# Patient Record
Sex: Female | Born: 1970
Health system: Southern US, Community
[De-identification: ages and names within clinical notes are randomized; demographics above are authoritative.]

## PROBLEM LIST (undated history)

## (undated) DIAGNOSIS — I10 Essential (primary) hypertension: Secondary | ICD-10-CM

## (undated) DIAGNOSIS — F329 Major depressive disorder, single episode, unspecified: Secondary | ICD-10-CM

## (undated) DIAGNOSIS — F32A Depression, unspecified: Secondary | ICD-10-CM

## (undated) HISTORY — PX: KNEE SURGERY: SHX244

---

## 2002-01-11 ENCOUNTER — Encounter: Admission: RE | Admit: 2002-01-11 | Discharge: 2002-04-11 | Payer: Self-pay | Admitting: Internal Medicine

## 2003-08-10 ENCOUNTER — Other Ambulatory Visit: Admission: RE | Admit: 2003-08-10 | Discharge: 2003-08-10 | Payer: Self-pay | Admitting: Obstetrics and Gynecology

## 2005-07-21 ENCOUNTER — Other Ambulatory Visit: Admission: RE | Admit: 2005-07-21 | Discharge: 2005-07-21 | Payer: Self-pay | Admitting: Obstetrics and Gynecology

## 2007-06-30 ENCOUNTER — Emergency Department (HOSPITAL_COMMUNITY): Admission: EM | Admit: 2007-06-30 | Discharge: 2007-06-30 | Payer: Self-pay | Admitting: Family Medicine

## 2007-10-29 ENCOUNTER — Emergency Department (HOSPITAL_COMMUNITY): Admission: EM | Admit: 2007-10-29 | Discharge: 2007-10-29 | Payer: Self-pay | Admitting: Emergency Medicine

## 2008-06-26 ENCOUNTER — Emergency Department (HOSPITAL_COMMUNITY): Admission: EM | Admit: 2008-06-26 | Discharge: 2008-06-26 | Payer: Self-pay | Admitting: Emergency Medicine

## 2008-06-28 ENCOUNTER — Emergency Department (HOSPITAL_COMMUNITY): Admission: EM | Admit: 2008-06-28 | Discharge: 2008-06-28 | Payer: Self-pay | Admitting: Family Medicine

## 2008-11-08 ENCOUNTER — Emergency Department: Payer: Self-pay | Admitting: Emergency Medicine

## 2009-03-06 ENCOUNTER — Emergency Department (HOSPITAL_COMMUNITY): Admission: EM | Admit: 2009-03-06 | Discharge: 2009-03-06 | Payer: Self-pay | Admitting: Family Medicine

## 2009-06-20 ENCOUNTER — Encounter: Admission: RE | Admit: 2009-06-20 | Discharge: 2009-09-18 | Payer: Self-pay | Admitting: Internal Medicine

## 2009-06-24 ENCOUNTER — Emergency Department (HOSPITAL_COMMUNITY): Admission: EM | Admit: 2009-06-24 | Discharge: 2009-06-24 | Payer: Self-pay | Admitting: Family Medicine

## 2009-10-01 IMAGING — CR DG SHOULDER 2+V*L*
3 series · 3 of 3 positions shown · non-contrast
Comparison: None

CLINICAL DATA: *PAIN;

LEFT SHOULDER - 2+ VIEW

[view not recorded (1 of 3)]
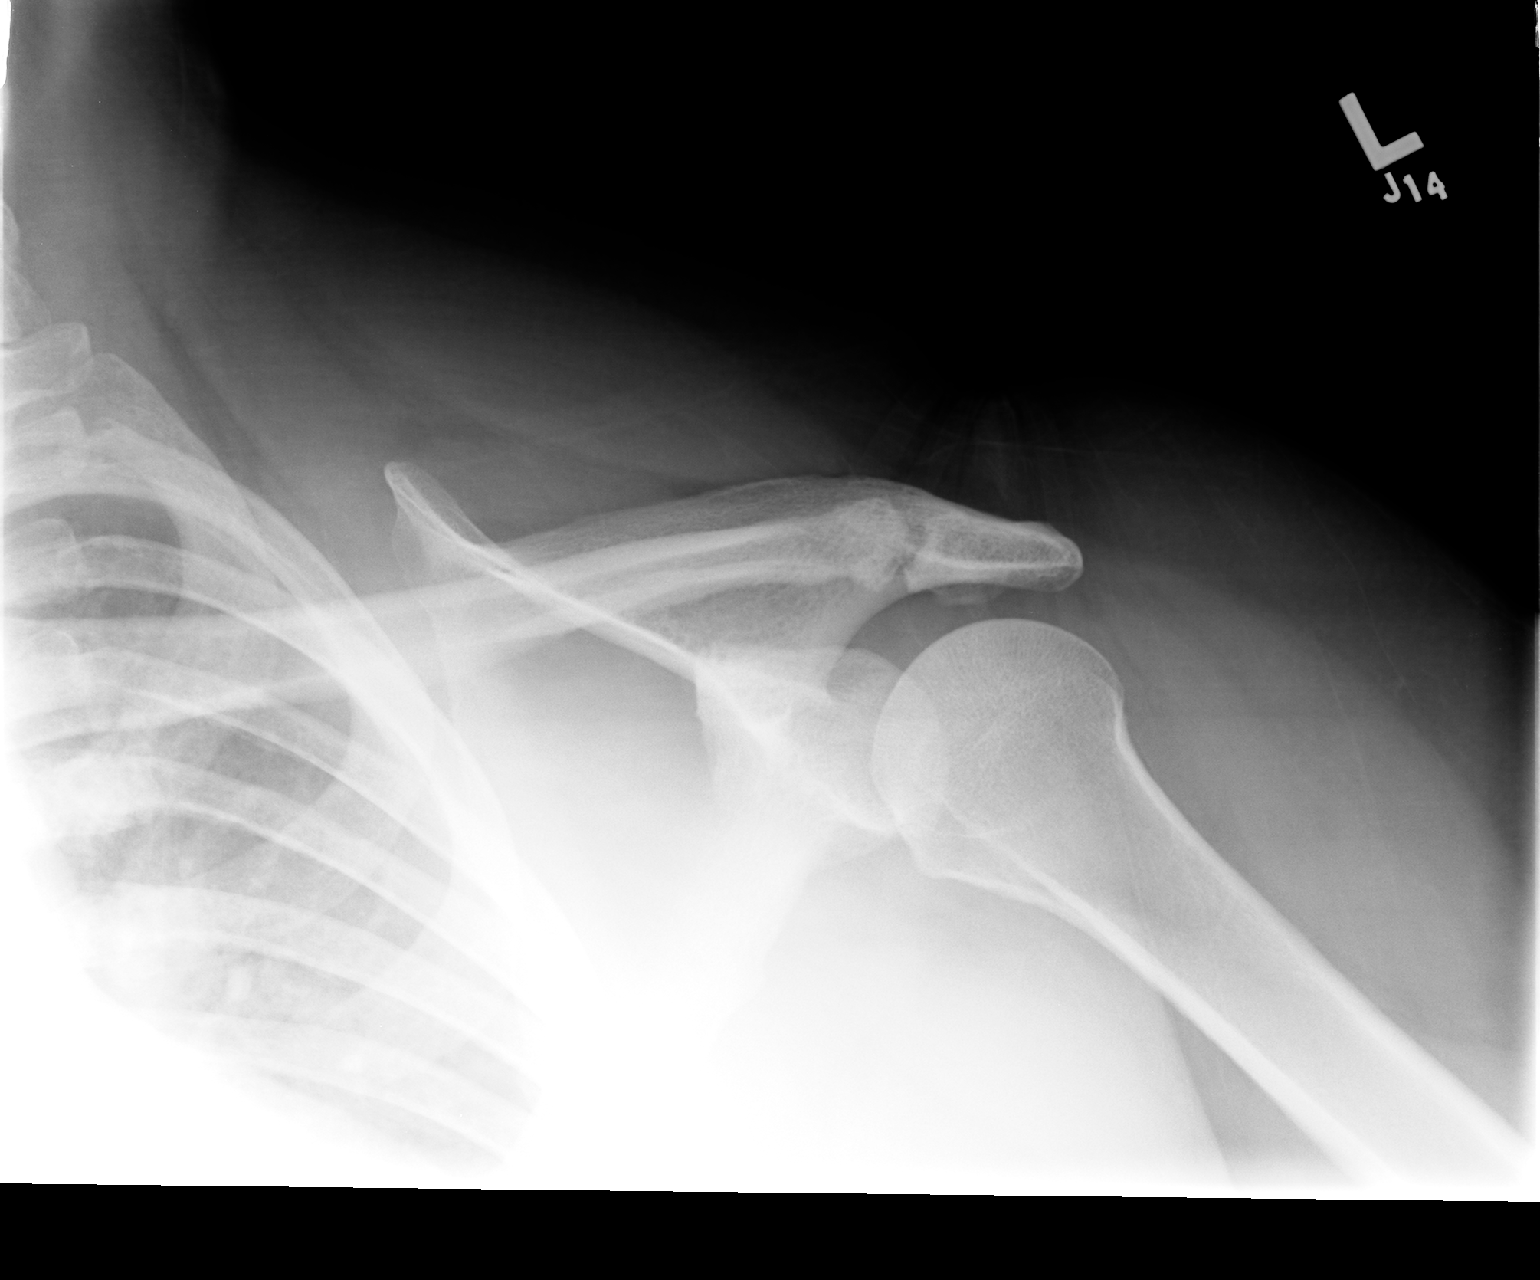

[view not recorded (2 of 3)]
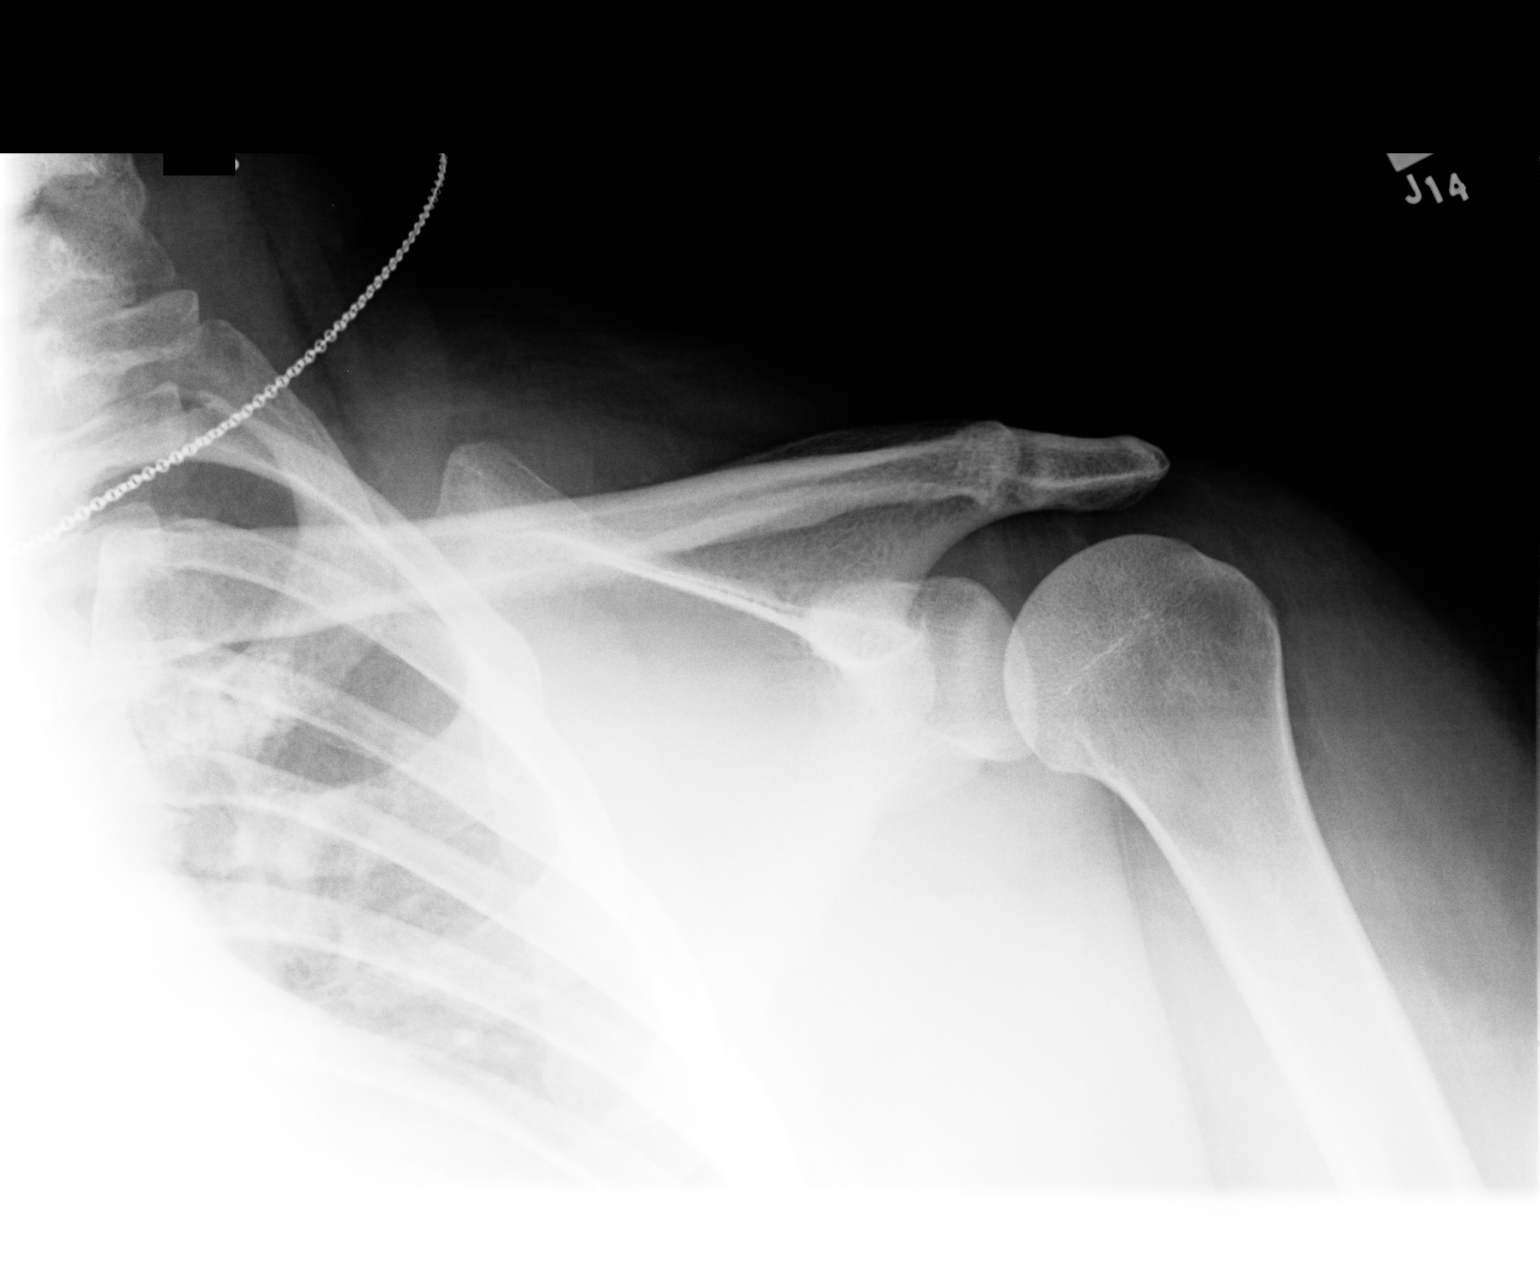

[view not recorded (3 of 3)]
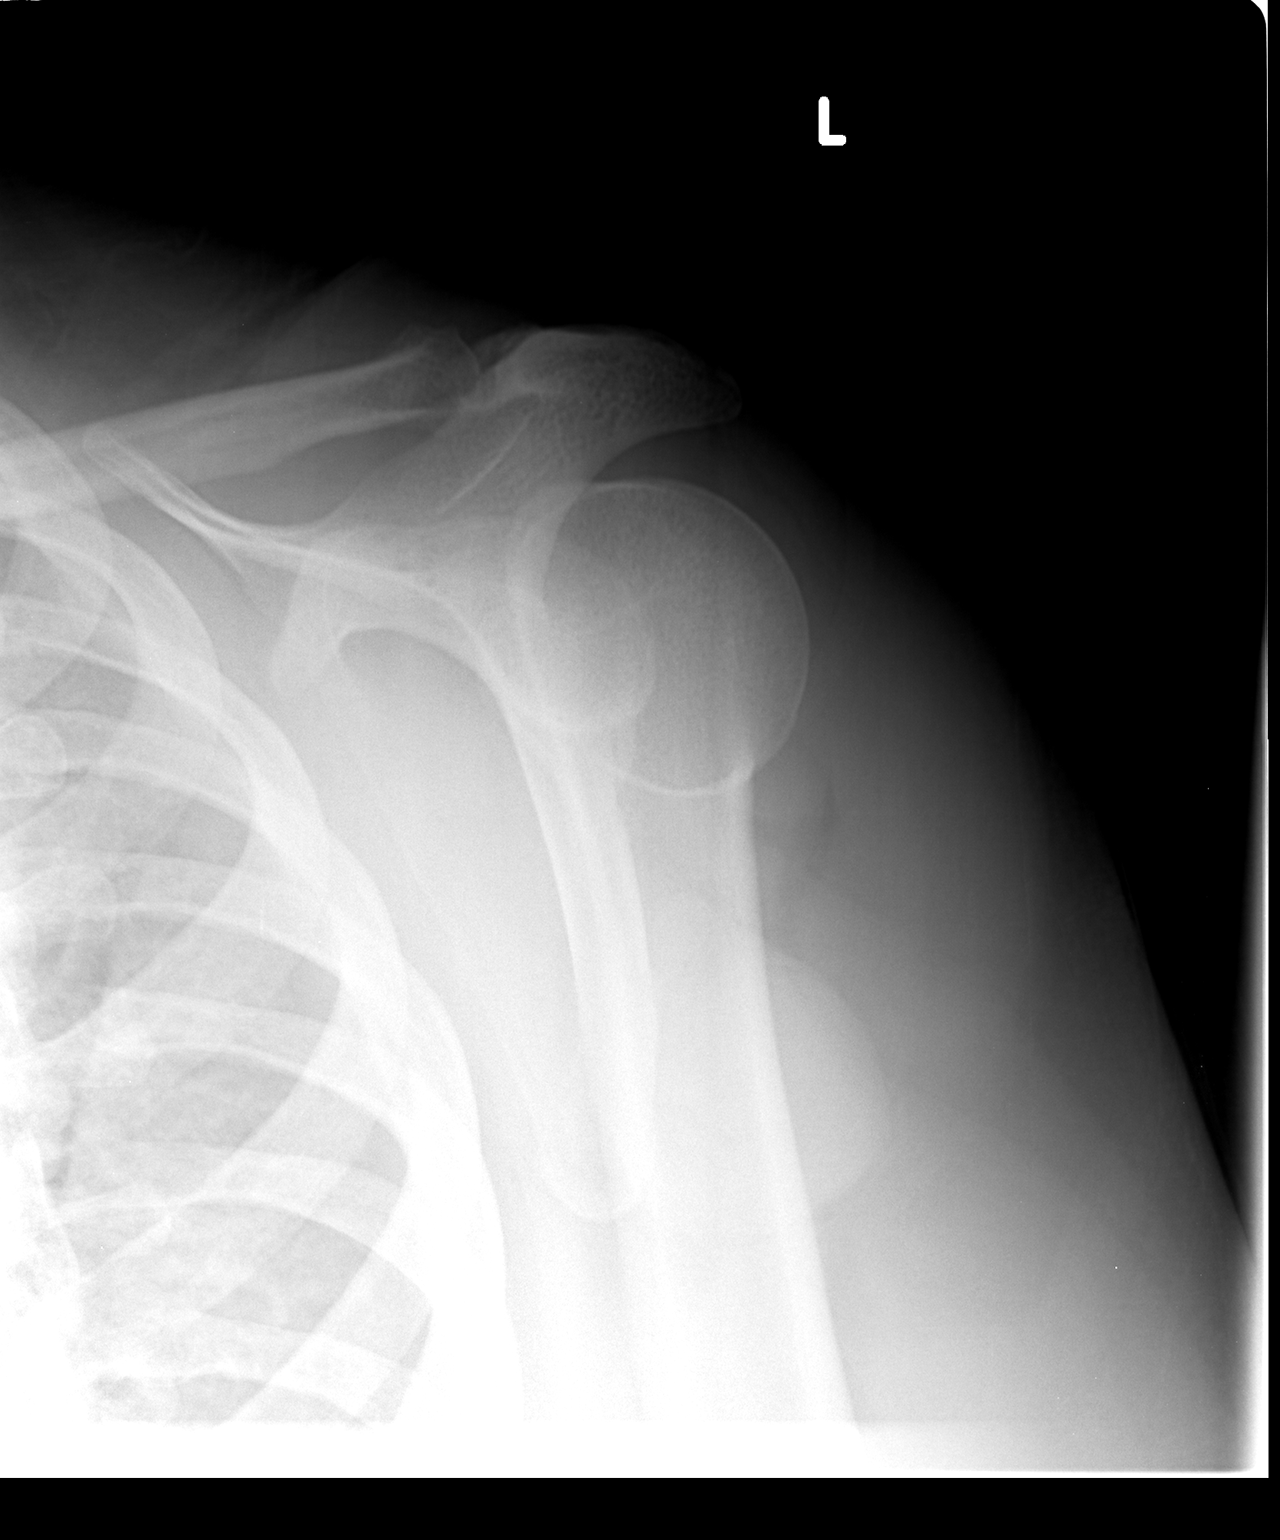

[3 of 3 positions shown; findings below may reference images not displayed]

FINDINGS: No fracture or dislocation.  There are degenerative
changes in the acromioclavicular joint.  Visualized left lung is
clear.
IMPRESSION: Acromioclavicular joint osteoarthritis.

## 2010-01-05 ENCOUNTER — Emergency Department (HOSPITAL_COMMUNITY): Admission: EM | Admit: 2010-01-05 | Discharge: 2010-01-05 | Payer: Self-pay | Admitting: Family Medicine

## 2010-03-04 ENCOUNTER — Emergency Department (HOSPITAL_COMMUNITY): Admission: EM | Admit: 2010-03-04 | Discharge: 2010-03-04 | Payer: Self-pay | Admitting: Family Medicine

## 2010-06-20 LAB — CULTURE, ROUTINE-ABSCESS

## 2010-07-01 LAB — CULTURE, ROUTINE-ABSCESS

## 2010-07-18 LAB — CULTURE, ROUTINE-ABSCESS

## 2010-08-06 ENCOUNTER — Inpatient Hospital Stay (INDEPENDENT_AMBULATORY_CARE_PROVIDER_SITE_OTHER)
Admission: RE | Admit: 2010-08-06 | Discharge: 2010-08-06 | Disposition: A | Discharge status: Home / Self Care | Payer: 59 | Source: Ambulatory Visit | Attending: Emergency Medicine | Admitting: Emergency Medicine

## 2010-08-06 DIAGNOSIS — J029 Acute pharyngitis, unspecified: Secondary | ICD-10-CM

## 2010-12-01 ENCOUNTER — Inpatient Hospital Stay (INDEPENDENT_AMBULATORY_CARE_PROVIDER_SITE_OTHER)
Admission: RE | Admit: 2010-12-01 | Discharge: 2010-12-01 | Disposition: A | Discharge status: Home / Self Care | Payer: 59 | Source: Ambulatory Visit | Attending: Family Medicine | Admitting: Family Medicine

## 2010-12-01 DIAGNOSIS — J029 Acute pharyngitis, unspecified: Secondary | ICD-10-CM

## 2011-11-29 ENCOUNTER — Encounter (HOSPITAL_COMMUNITY): Payer: Self-pay | Admitting: Emergency Medicine

## 2011-11-29 ENCOUNTER — Emergency Department (HOSPITAL_COMMUNITY)
Admission: EM | Admit: 2011-11-29 | Discharge: 2011-11-29 | Disposition: A | Discharge status: Home / Self Care | Payer: 59 | Source: Home / Self Care | Attending: Emergency Medicine | Admitting: Emergency Medicine

## 2011-11-29 DIAGNOSIS — J069 Acute upper respiratory infection, unspecified: Secondary | ICD-10-CM

## 2011-11-29 DIAGNOSIS — J209 Acute bronchitis, unspecified: Secondary | ICD-10-CM

## 2011-11-29 DIAGNOSIS — J019 Acute sinusitis, unspecified: Secondary | ICD-10-CM

## 2011-11-29 DIAGNOSIS — H6693 Otitis media, unspecified, bilateral: Secondary | ICD-10-CM

## 2011-11-29 HISTORY — DX: Major depressive disorder, single episode, unspecified: F32.9

## 2011-11-29 HISTORY — DX: Essential (primary) hypertension: I10

## 2011-11-29 HISTORY — DX: Depression, unspecified: F32.A

## 2011-11-29 MED ORDER — AMOXICILLIN-POT CLAVULANATE 875-125 MG PO TABS: 1.0000 | ORAL_TABLET | Freq: Two times a day (BID) | ORAL | Status: AC

## 2011-11-29 MED ORDER — ALBUTEROL SULFATE HFA 108 (90 BASE) MCG/ACT IN AERS: 1.0000 | INHALATION_SPRAY | Freq: Four times a day (QID) | RESPIRATORY_TRACT | Status: AC | PRN

## 2011-11-29 MED ORDER — HYDROCOD POLST-CHLORPHEN POLST 10-8 MG/5ML PO LQCR: 5.0000 mL | Freq: Two times a day (BID) | ORAL | Status: AC | PRN

## 2011-11-29 MED ORDER — FLUTICASONE PROPIONATE 50 MCG/ACT NA SUSP: 2.0000 | Freq: Every day | NASAL | Status: AC

## 2011-11-29 NOTE — ED Notes (Signed)
Dr Lorenz Coaster aware of blood pressure readings obtained today by tim forsyth, emt

## 2011-11-29 NOTE — ED Provider Notes (Signed)
Chief Complaint  Patient presents with  . URI    History of Present Illness:   Brittany Peterson is a 41 year old female who has had a one-week history of nasal congestion with yellow-green drainage, headache, cough productive of tan sputum, right ear pain, fever, and sore throat. She has diabetes and hypertension and is on birth control pills. She denies any chest pain, wheezing, shortness of breath, or GI complaints.  Review of Systems:  Other than noted above, the patient denies any of the following symptoms. Systemic:  No fever, chills, sweats, fatigue, myalgias, headache, or anorexia. Eye:  No redness, pain or drainage. ENT:  No earache, ear congestion, nasal congestion, sneezing, rhinorrhea, sinus pressure, sinus pain, post nasal drip, or sore throat. Lungs:  No cough, sputum production, wheezing, shortness of breath, or chest pain. GI:  No abdominal pain, nausea, vomiting, or diarrhea. Skin:  No rash or itching.  PMFSH:  Past medical history, family history, social history, meds, and allergies were reviewed.  Physical Exam:   Vital signs:  BP 165/85  Pulse 103  Temp 98.9 F (37.2 C) (Oral)  Resp 20  SpO2 97%  LMP 10/24/2011 General:  Alert, in no distress. Eye:  No conjunctival injection or drainage. Lids were normal. ENT:  Her right TM was pink in her left TM was red. The canals were clear.  Nasal mucosa was clear and uncongested, without drainage.  Mucous membranes were moist.  Pharynx was clear, without exudate or drainage.  There were no oral ulcerations or lesions. Neck:  Supple, no adenopathy, tenderness or mass. Lungs:  No respiratory distress.  Lungs were clear to auscultation, rales or rhonchi.  She did have a single expiratory wheeze in the left upper lobe, but this cleared after deep inspiration. Breath sounds were clear and equal bilaterally. Lungs were resonant to percussion.  No egophony. Heart:  Regular rhythm, without gallops, murmers or rubs. Skin:  Clear, warm, and dry,  without rash or lesions.  Assessment:  The primary encounter diagnosis was Viral upper respiratory infection. Diagnoses of Acute bronchitis, Acute sinus infection, and Bilateral otitis media were also pertinent to this visit.  Plan:   1.  The following meds were prescribed:   New Prescriptions   ALBUTEROL (PROVENTIL HFA;VENTOLIN HFA) 108 (90 BASE) MCG/ACT INHALER    Inhale 1-2 puffs into the lungs every 6 (six) hours as needed for wheezing.   AMOXICILLIN-CLAVULANATE (AUGMENTIN) 875-125 MG PER TABLET    Take 1 tablet by mouth 2 (two) times daily.   CHLORPHENIRAMINE-HYDROCODONE (TUSSIONEX) 10-8 MG/5ML LQCR    Take 5 mLs by mouth every 12 (twelve) hours as needed.   FLUTICASONE (FLONASE) 50 MCG/ACT NASAL SPRAY    Place 2 sprays into the nose daily.   2.  The patient was instructed in symptomatic care and handouts were given. 3.  The patient was told to return if becoming worse in any way, if no better in 3 or 4 days, and given some red flag symptoms that would indicate earlier return.   Reuben Likes, MD 11/29/11 2139

## 2011-11-29 NOTE — ED Notes (Signed)
Started with nasal drainage, then cough.  Productive- cough at times, yellow-greenish color.  Headache, feverish initially, but not now.

## 2011-11-29 NOTE — ED Notes (Signed)
Dr Lorenz Coaster came in room during triage process.

## 2012-03-08 ENCOUNTER — Emergency Department (HOSPITAL_COMMUNITY)
Admit: 2012-03-08 | Discharge: 2012-03-08 | Disposition: A | Discharge status: Home / Self Care | Payer: 59 | Attending: Emergency Medicine | Admitting: Emergency Medicine

## 2012-03-08 ENCOUNTER — Encounter (HOSPITAL_COMMUNITY): Payer: Self-pay | Admitting: Emergency Medicine

## 2012-03-08 ENCOUNTER — Emergency Department (HOSPITAL_COMMUNITY)
Admission: EM | Admit: 2012-03-08 | Discharge: 2012-03-08 | Disposition: A | Discharge status: Home / Self Care | Payer: 59 | Source: Home / Self Care | Attending: Emergency Medicine | Admitting: Emergency Medicine

## 2012-03-08 DIAGNOSIS — M25569 Pain in unspecified knee: Secondary | ICD-10-CM | POA: Insufficient documentation

## 2012-03-08 MED ORDER — MELOXICAM 7.5 MG PO TABS: 7.5000 mg | ORAL_TABLET | Freq: Every day | ORAL | Status: AC

## 2012-03-08 MED ORDER — KETOROLAC TROMETHAMINE 60 MG/2ML IM SOLN
60.0000 mg | Freq: Once | INTRAMUSCULAR | Status: AC
  Administered 2012-03-08: 60 mg via INTRAMUSCULAR

## 2012-03-08 MED ORDER — KETOROLAC TROMETHAMINE 60 MG/2ML IM SOLN
INTRAMUSCULAR | Status: AC
  Filled 2012-03-08: qty 2

## 2012-03-08 MED ORDER — TRAMADOL HCL 50 MG PO TABS: 50.0000 mg | ORAL_TABLET | Freq: Four times a day (QID) | ORAL | Status: AC | PRN

## 2012-03-08 NOTE — ED Notes (Signed)
Ryan in Radiology made aware patient was on the way

## 2012-03-08 NOTE — ED Provider Notes (Signed)
History     CSN: [id number]  Arrival date & time 03/08/12  1411   None     Chief Complaint  Patient presents with  . Knee Pain    (Consider location/radiation/quality/duration/timing/severity/associated sxs/prior treatment) Patient is a 41 y.o. female presenting with knee pain. The history is provided by the patient.  Knee Pain This is a new problem. The current episode started 2 days ago. The problem has not changed since onset.The symptoms are aggravated by walking. Nothing relieves the symptoms. She has tried nothing for the symptoms.  complains of left knee pain.  Mechaniism of injury: none known.  Symptoms have been intermittent, noticed to be worse after sitting for awhile and then attempting to ambulate.  No prior history of related problems.  Has taken aspirin and ibuprofen for pain with no relief.  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   Is currently on prednisone and naprosyn for known SI joint pain.   Past Medical History  Diagnosis Date  . Hypertension   . Diabetes mellitus   . Depression     Past Surgical History  Procedure Date  . Knee surgery     No family history on file.  History  Substance Use Topics  . Smoking status: Never Smoker   . Smokeless tobacco: Not on file  . Alcohol Use: No    OB History    Grav Para Term Preterm Abortions TAB SAB Ect Mult Living                  Review of Systems  Musculoskeletal: Positive for arthralgias and gait problem.  All other systems reviewed and are negative.    Allergies  Keflex  Home Medications   Current Outpatient Rx  Name  Route  Sig  Dispense  Refill  . CYMBALTA PO   Oral   Take by mouth.         . METFORMIN HCL PO   Oral   Take by mouth.         . ALBUTEROL SULFATE HFA 108 (90 BASE) MCG/ACT IN AERS   Inhalation   Inhale 1-2 puffs into the lungs every 6 (six) hours as needed for wheezing.   1 Inhaler   0   . HYDROCOD POLST-CPM POLST ER 10-8 MG/5ML PO LQCR   Oral   Take 5 mLs by mouth  every 12 (twelve) hours as needed.   140 mL   0   . FLUTICASONE PROPIONATE 50 MCG/ACT NA SUSP   Nasal   Place 2 sprays into the nose daily.   16 g   0   . VICTOZA [place]   Subcutaneous   Inject into the skin.         Marland Kitchen LOSARTAN POTASSIUM PO   Oral   Take by mouth.         . MELOXICAM 7.5 MG PO TABS   Oral   Take 1 tablet (7.5 mg total) by mouth daily.   30 tablet   1   . OVER THE COUNTER MEDICATION      Birth counter pill. Patient reports taking multiple cold medicines         . TRAMADOL HCL 50 MG PO TABS   Oral   Take 1 tablet (50 mg total) by mouth every 6 (six) hours as needed for pain.   15 tablet   0     BP 149/80  Pulse 102  Temp 98.5 F (36.9 C) (Oral)  Resp 16  SpO2  99%  LMP 02/26/2012  Physical Exam  Nursing note and vitals reviewed. Constitutional: She is oriented to person, place, and time. Vital signs are normal. She appears well-developed and well-nourished. She is active and cooperative.  HENT:  Head: Normocephalic.  Eyes: Conjunctivae normal are normal. Pupils are equal, round, and reactive to light. No scleral icterus.  Neck: Trachea normal. Neck supple.  Cardiovascular: Normal rate, regular rhythm and intact distal pulses.   Pulmonary/Chest: Effort normal and breath sounds normal.  Musculoskeletal: Normal range of motion.       Right knee: Normal.       Left knee: Normal.       Decreased extension noted secondary to pain  Neurological: She is alert and oriented to person, place, and time. No cranial nerve deficit or sensory deficit.  Skin: Skin is warm and dry.  Psychiatric: She has a normal mood and affect. Her speech is normal and behavior is normal. Judgment and thought content normal. Cognition and memory are normal.    ED Course  Procedures (including critical care time)  Labs Reviewed - No data to display Dg Knee Complete 4 Views Left  03/08/2012  *RADIOLOGY REPORT*  Clinical Data: Pain, no known injury  LEFT KNEE - COMPLETE  4+ VIEW  Comparison: None.  Findings: Four views of the left knee submitted.  No acute fracture or subluxation.  There is narrowing of medial joint compartment. Spurring of medial and lateral femoral condyle.  Spurring of medial tibial plateau.  Narrowing of patellofemoral joint space.  No joint effusion.  IMPRESSION: No acute fracture or subluxation.  Degenerative changes as described above.   Original Report Authenticated By: Natasha Mead, M.D.      1. Knee pain       MDM  Continue medication as previously prescribed.  Add mobic and ultram as needed.  Heat therapy.  Follow up with orthopedist in one week as needed.          Johnsie Kindred, NP 03/08/12 1726

## 2012-03-08 NOTE — ED Notes (Signed)
Pt c/o left knee pain since Friday... Painful to extend and flex knee... Denies: inj/trauma to site, fevers, vomiting, nauseas, diarrhea... Pain will increase w/activity... Has taken NSAID'S w/little relief.

## 2012-03-09 NOTE — ED Provider Notes (Signed)
Medical screening examination/treatment/procedure(s) were performed by non-physician practitioner and as supervising physician I was immediately available for consultation/collaboration.  Leslee Home, M.D.   Reuben Likes, MD 03/09/12 [phone]

## 2013-10-18 ENCOUNTER — Emergency Department (HOSPITAL_COMMUNITY)
Admission: EM | Admit: 2013-10-18 | Discharge: 2013-10-18 | Disposition: A | Discharge status: Home / Self Care | Payer: 59 | Source: Home / Self Care | Attending: Family Medicine | Admitting: Family Medicine

## 2013-10-18 ENCOUNTER — Encounter (HOSPITAL_COMMUNITY): Payer: Self-pay | Admitting: Emergency Medicine

## 2013-10-18 DIAGNOSIS — J302 Other seasonal allergic rhinitis: Secondary | ICD-10-CM

## 2013-10-18 DIAGNOSIS — J309 Allergic rhinitis, unspecified: Secondary | ICD-10-CM

## 2013-10-18 MED ORDER — IPRATROPIUM BROMIDE 0.06 % NA SOLN: 2.0000 | Freq: Four times a day (QID) | NASAL | Status: AC

## 2013-10-18 MED ORDER — MINOCYCLINE HCL 100 MG PO CAPS: 100.0000 mg | ORAL_CAPSULE | Freq: Two times a day (BID) | ORAL | Status: AC

## 2013-10-18 NOTE — ED Provider Notes (Signed)
CSN: [id number]     Arrival date & time 10/18/13  1409 History   First MD Initiated Contact with Patient 10/18/13 1448     Chief Complaint  Patient presents with  . URI   (Consider location/radiation/quality/duration/timing/severity/associated sxs/prior Treatment) Patient is a 43 y.o. female presenting with URI. The history is provided by the patient.  URI Presenting symptoms: congestion, cough and rhinorrhea   Presenting symptoms: no fever   Severity:  Moderate Onset quality:  Gradual Duration:  4 days Progression:  Unchanged Chronicity:  New Relieved by:  None tried Worsened by:  Nothing tried Ineffective treatments:  None tried Associated symptoms: sneezing   Associated symptoms: no wheezing     Past Medical History  Diagnosis Date  . Hypertension   . Diabetes mellitus   . Depression    Past Surgical History  Procedure Laterality Date  . Knee surgery     No family history on file. History  Substance Use Topics  . Smoking status: Never Smoker   . Smokeless tobacco: Not on file  . Alcohol Use: No   OB History   Grav Para Term Preterm Abortions TAB SAB Ect Mult Living                 Review of Systems  Constitutional: Negative.  Negative for fever.  HENT: Positive for congestion, postnasal drip, rhinorrhea and sneezing.   Respiratory: Positive for cough. Negative for shortness of breath and wheezing.   Cardiovascular: Negative.   Hematological: Negative for adenopathy.    Allergies  Keflex  Home Medications   Prior to Admission medications   Medication Sig Start Date End Date Taking? Authorizing Provider  DULoxetine HCl (CYMBALTA PO) Take by mouth.   Yes Historical Provider, MD  LOSARTAN POTASSIUM PO Take by mouth.   Yes Historical Provider, MD  METFORMIN HCL PO Take by mouth.   Yes Historical Provider, MD  albuterol (PROVENTIL HFA;VENTOLIN HFA) 108 (90 BASE) MCG/ACT inhaler Inhale 1-2 puffs into the lungs every 6 (six) hours as needed for wheezing.  11/29/11 11/28/12  Reuben Likes, MD  chlorpheniramine-HYDROcodone (TUSSIONEX) 10-8 MG/5ML [facility] Take 5 mLs by mouth every 12 (twelve) hours as needed. 11/29/11   Reuben Likes, MD  fluticasone (FLONASE) 50 MCG/ACT nasal spray Place 2 sprays into the nose daily. 11/29/11 11/28/12  Reuben Likes, MD  ipratropium (ATROVENT) 0.06 % nasal spray Place 2 sprays into both nostrils 4 (four) times daily. 10/18/13   Linna Hoff, MD  Liraglutide (VICTOZA [place]) Inject into the skin.    Historical Provider, MD  meloxicam (MOBIC) 7.5 MG tablet Take 1 tablet (7.5 mg total) by mouth daily. 03/08/12   Johnsie Kindred, NP  minocycline (MINOCIN,DYNACIN) 100 MG capsule Take 1 capsule (100 mg total) by mouth 2 (two) times daily. 10/18/13   Linna Hoff, MD  OVER THE COUNTER MEDICATION Birth counter pill. Patient reports taking multiple cold medicines    Historical Provider, MD  traMADol (ULTRAM) 50 MG tablet Take 1 tablet (50 mg total) by mouth every 6 (six) hours as needed for pain. 03/08/12   Johnsie Kindred, NP   BP 139/80  Pulse 92  Temp(Src) 98.6 F (37 C) (Oral)  Resp 20  SpO2 95%  LMP 10/04/2013 Physical Exam  Nursing note and vitals reviewed. Constitutional: She is oriented to person, place, and time. She appears well-developed and well-nourished. No distress.  HENT:  Right Ear: External ear normal.  Left Ear: External ear normal.  Nose:  Mucosal edema and rhinorrhea present.  Mouth/Throat: Oropharynx is clear and moist.  Neck: Normal range of motion. Neck supple.  Cardiovascular: Regular rhythm and normal heart sounds.   Pulmonary/Chest: Effort normal and breath sounds normal.  Neurological: She is alert and oriented to person, place, and time.  Skin: Skin is warm and dry.    ED Course  Procedures (including critical care time) Labs Review Labs Reviewed - No data to display  Imaging Review No results found.   MDM   1. Seasonal allergic reaction        Linna HoffJames D Kindl, MD 10/18/13  [phone]

## 2013-10-18 NOTE — ED Notes (Signed)
Pt c/o cold/sinus sx x 4 days Sx include: productive cough, HA, runny nose, congestion Denies f/v/n/d, SOB, wheezing Taking OTC cold meds w/no relief Alert w/no signs of acute distress.

## 2015-05-03 DIAGNOSIS — I10 Essential (primary) hypertension: Secondary | ICD-10-CM | POA: Diagnosis not present

## 2015-05-03 DIAGNOSIS — F509 Eating disorder, unspecified: Secondary | ICD-10-CM | POA: Diagnosis not present

## 2015-05-03 DIAGNOSIS — S90811A Abrasion, right foot, initial encounter: Secondary | ICD-10-CM | POA: Diagnosis not present

## 2015-05-03 DIAGNOSIS — Z3009 Encounter for other general counseling and advice on contraception: Secondary | ICD-10-CM | POA: Diagnosis not present

## 2015-05-03 DIAGNOSIS — F321 Major depressive disorder, single episode, moderate: Secondary | ICD-10-CM | POA: Diagnosis not present

## 2015-05-03 DIAGNOSIS — R635 Abnormal weight gain: Secondary | ICD-10-CM | POA: Diagnosis not present

## 2015-05-03 DIAGNOSIS — E11621 Type 2 diabetes mellitus with foot ulcer: Secondary | ICD-10-CM | POA: Diagnosis not present

## 2015-05-03 MED FILL — TRI-PREVIFEM TABLET: 0.18/0.215/ | 84 days supply | Qty: 84 | Fill #0

## 2015-05-10 MED FILL — LOSARTAN POTASSIUM 100 MG T: 100 | 30 days supply | Qty: 30 | Fill #1

## 2015-05-10 MED FILL — VICTOZA 18 MG/3 ML INJECT P: 18 | 30 days supply | Qty: 9 | Fill #1

## 2015-05-10 MED FILL — VIT D2 1.25 MG (50,000 UNIT: 1.25 MG | 56 days supply | Qty: 16 | Fill #0

## 2015-05-10 MED FILL — ESCITALOPRAM 20 MG TABLET: 20 | 30 days supply | Qty: 30 | Fill #1

## 2015-06-29 MED FILL — NOVOTWIST 32G X 5 MM MISC: 32G X 5 MM | 30 days supply | Qty: 100 | Fill #0

## 2015-06-29 MED FILL — VICTOZA 18 MG/3 ML INJECT P: 18 | 30 days supply | Qty: 9 | Fill #2

## 2015-06-29 MED FILL — LOSARTAN POTASSIUM 100 MG T: 100 | 30 days supply | Qty: 30 | Fill #2

## 2015-06-29 MED FILL — ESCITALOPRAM 20 MG TABLET: 20 | 30 days supply | Qty: 30 | Fill #2

## 2015-06-29 MED FILL — metFORMIN HCL 1000 MG TABS: 1000 | 90 days supply | Qty: 180 | Fill #0

## 2015-07-16 DIAGNOSIS — I10 Essential (primary) hypertension: Secondary | ICD-10-CM | POA: Diagnosis not present

## 2015-07-16 DIAGNOSIS — L638 Other alopecia areata: Secondary | ICD-10-CM | POA: Diagnosis not present

## 2015-07-16 DIAGNOSIS — F321 Major depressive disorder, single episode, moderate: Secondary | ICD-10-CM | POA: Diagnosis not present

## 2015-07-16 DIAGNOSIS — F509 Eating disorder, unspecified: Secondary | ICD-10-CM | POA: Diagnosis not present

## 2015-07-16 DIAGNOSIS — E11621 Type 2 diabetes mellitus with foot ulcer: Secondary | ICD-10-CM | POA: Diagnosis not present

## 2015-07-16 MED FILL — TRI-PREVIFEM TABLET: 0.18/0.215/ | 84 days supply | Qty: 84 | Fill #0

## 2015-07-16 MED FILL — TRESIBA FLEXTOUCH 200 UNITS: 200 | 90 days supply | Qty: 9 | Fill #0

## 2015-08-10 MED FILL — LOSARTAN POTASSIUM 100 MG T: 100 | 30 days supply | Qty: 30 | Fill #0

## 2015-08-10 MED FILL — VYVANSE 20 MG CAPSULE: 20 | 30 days supply | Qty: 30 | Fill #0

## 2015-08-10 MED FILL — ESCITALOPRAM 20 MG TABLET: 20 | 30 days supply | Qty: 30 | Fill #0

## 2015-08-13 DIAGNOSIS — I10 Essential (primary) hypertension: Secondary | ICD-10-CM | POA: Diagnosis not present

## 2015-08-13 DIAGNOSIS — F321 Major depressive disorder, single episode, moderate: Secondary | ICD-10-CM | POA: Diagnosis not present

## 2015-08-13 DIAGNOSIS — F430 Acute stress reaction: Secondary | ICD-10-CM | POA: Diagnosis not present

## 2015-08-13 MED FILL — ALPRAZolam 0.5 MG TABS: 0.5 | 14 days supply | Qty: 56 | Fill #0

## 2015-09-12 MED FILL — LOSARTAN POTASSIUM 100 MG T: 100 | 30 days supply | Qty: 30 | Fill #1

## 2015-09-12 MED FILL — ESCITALOPRAM 20 MG TABLET: 20 | 30 days supply | Qty: 30 | Fill #1

## 2015-10-19 MED FILL — TRI-PREVIFEM TABLET: 0.18/0.215/ | 84 days supply | Qty: 84 | Fill #1

## 2015-10-19 MED FILL — ESCITALOPRAM 20 MG TABLET: 20 | 30 days supply | Qty: 30 | Fill #2

## 2015-10-19 MED FILL — LOSARTAN POTASSIUM 100 MG T: 100 | 30 days supply | Qty: 30 | Fill #2

## 2015-10-22 DIAGNOSIS — I10 Essential (primary) hypertension: Secondary | ICD-10-CM | POA: Diagnosis not present

## 2015-10-22 DIAGNOSIS — F321 Major depressive disorder, single episode, moderate: Secondary | ICD-10-CM | POA: Diagnosis not present

## 2015-10-22 DIAGNOSIS — Z6841 Body Mass Index (BMI) 40.0 and over, adult: Secondary | ICD-10-CM | POA: Diagnosis not present

## 2015-10-22 DIAGNOSIS — E11621 Type 2 diabetes mellitus with foot ulcer: Secondary | ICD-10-CM | POA: Diagnosis not present

## 2015-10-22 DIAGNOSIS — F509 Eating disorder, unspecified: Secondary | ICD-10-CM | POA: Diagnosis not present

## 2015-10-22 DIAGNOSIS — R635 Abnormal weight gain: Secondary | ICD-10-CM | POA: Diagnosis not present

## 2015-10-22 MED FILL — NOVOFINE 32G NEEDLES: 32G X 6 MM | 90 days supply | Qty: 100 | Fill #0

## 2015-10-22 MED FILL — ACYCLOVIR 400 MG TABLET: 400 | 7 days supply | Qty: 21 | Fill #0

## 2015-10-22 MED FILL — OLMESARTAN-HCTZ 20-12.5 MG: 20-12.5 | 30 days supply | Qty: 30 | Fill #0

## 2015-10-22 MED FILL — XULTOPHY 100 UNIT-3.6MG/ML: 100-3.6 | 90 days supply | Qty: 15 | Fill #0

## 2015-10-22 MED FILL — VYVANSE 30 MG CAPSULE: 30 | 30 days supply | Qty: 30 | Fill #0

## 2015-12-06 MED FILL — LOSARTAN POTASSIUM 100 MG T: 100 | 30 days supply | Qty: 30 | Fill #3

## 2015-12-06 MED FILL — OLMESARTAN-HCTZ 20-12.5 MG: 20-12.5 | 30 days supply | Qty: 30 | Fill #1

## 2015-12-13 MED FILL — ESCITALOPRAM 20 MG TABLET: 20 | 30 days supply | Qty: 30 | Fill #0

## 2016-01-23 MED FILL — TRI-PREVIFEM TABLET: 0.18/0.215/ | 84 days supply | Qty: 84 | Fill #1

## 2016-01-23 MED FILL — OLMESARTAN-HCTZ 20-12.5 MG: 20-12.5 | 30 days supply | Qty: 30 | Fill #2

## 2016-01-23 MED FILL — NOVOFINE PLUS PEN NDL 32GX1: 32G X 4 MM | 90 days supply | Qty: 100 | Fill #0

## 2016-01-23 MED FILL — ESCITALOPRAM 20 MG TABLET: 20 | 30 days supply | Qty: 30 | Fill #1

## 2016-01-23 MED FILL — XULTOPHY 100 UNIT-3.6MG/ML: 100-3.6 | 90 days supply | Qty: 15 | Fill #1

## 2016-02-25 DIAGNOSIS — H524 Presbyopia: Secondary | ICD-10-CM | POA: Diagnosis not present

## 2016-03-05 MED FILL — ESCITALOPRAM 20 MG TABLET: 20 | 30 days supply | Qty: 30 | Fill #2

## 2016-03-05 MED FILL — LOSARTAN POTASSIUM 100 MG T: 100 | 90 days supply | Qty: 90 | Fill #0

## 2016-03-26 MED FILL — OLMESARTAN-HCTZ 20-12.5 MG: 20-12.5 | 90 days supply | Qty: 90 | Fill #0

## 2016-06-20 DIAGNOSIS — J039 Acute tonsillitis, unspecified: Secondary | ICD-10-CM | POA: Diagnosis not present

## 2016-06-20 DIAGNOSIS — J029 Acute pharyngitis, unspecified: Secondary | ICD-10-CM | POA: Diagnosis not present

## 2016-06-20 DIAGNOSIS — Z79899 Other long term (current) drug therapy: Secondary | ICD-10-CM | POA: Diagnosis not present

## 2016-06-20 MED FILL — NOVOFINE 32G NEEDLES: 32G X 6 MM | 90 days supply | Qty: 100 | Fill #0

## 2016-06-20 MED FILL — OLMESARTAN-HCTZ 20-12.5 MG: 20-12.5 | 30 days supply | Qty: 30 | Fill #0

## 2016-06-20 MED FILL — ESCITALOPRAM 20 MG TABLET: 20 | 30 days supply | Qty: 30 | Fill #0

## 2016-06-20 MED FILL — XULTOPHY 100 UNIT-3.6MG/ML: 100-3.6 | 90 days supply | Qty: 15 | Fill #0

## 2016-06-20 MED FILL — AMOXICILLIN 875 MG TABLET: 875 | 7 days supply | Qty: 14 | Fill #0

## 2016-06-20 MED FILL — LOSARTAN POTASSIUM 100 MG T: 100 | 30 days supply | Qty: 30 | Fill #0

## 2016-07-03 MED FILL — AMLODIPINE BESYLATE 10 MG T: 10 | 90 days supply | Qty: 90 | Fill #0

## 2016-09-17 MED FILL — LOSARTAN POTASSIUM 100 MG T: 100 | 90 days supply | Qty: 90 | Fill #0

## 2016-09-17 MED FILL — AMLODIPINE BESYLATE 10 MG T: 10 | 90 days supply | Qty: 90 | Fill #0

## 2016-09-17 MED FILL — ESCITALOPRAM 20 MG TABLET: 20 | 90 days supply | Qty: 90 | Fill #0

## 2017-03-02 MED FILL — ESCITALOPRAM 20 MG TABLET: 20 | 30 days supply | Qty: 30 | Fill #0

## 2017-11-09 MED FILL — AMLODIPINE BESYLATE 10 MG T: 10 | 30 days supply | Qty: 30 | Fill #0

## 2017-11-09 MED FILL — ESCITALOPRAM 20 MG TABLET: 20 | 30 days supply | Qty: 30 | Fill #0

## 2017-11-09 MED FILL — LOSARTAN POTASSIUM 100 MG T: 100 | 30 days supply | Qty: 30 | Fill #0

## 2017-12-28 MED FILL — LOSARTAN POTASSIUM 100 MG T: 100 | 30 days supply | Qty: 30 | Fill #0

## 2017-12-28 MED FILL — ESCITALOPRAM 20 MG TABLET: 20 | 30 days supply | Qty: 30 | Fill #0

## 2017-12-28 MED FILL — LEVOCETIRIZINE 5 MG TABLET: 5 | 30 days supply | Qty: 30 | Fill #0

## 2017-12-28 MED FILL — AMLODIPINE BESYLATE 10 MG T: 10 | 30 days supply | Qty: 30 | Fill #0

## 2017-12-28 MED FILL — OZEMPIC 0.25 OR 0.5 MG/DOSE: 2 | 28 days supply | Qty: 2 | Fill #0

## 2017-12-28 MED FILL — JARDIANCE 10 MG TABLET: 10 | 30 days supply | Qty: 30 | Fill #0

## 2018-02-02 MED FILL — JARDIANCE 10 MG TABLET: 10 | 30 days supply | Qty: 30 | Fill #1

## 2018-02-02 MED FILL — ESCITALOPRAM 20 MG TABLET: 20 | 30 days supply | Qty: 30 | Fill #1

## 2018-02-02 MED FILL — LOSARTAN POTASSIUM 100 MG T: 100 | 30 days supply | Qty: 30 | Fill #1

## 2018-02-02 MED FILL — ADDERALL XR 20 MG CAP SA: 20 | 30 days supply | Qty: 30 | Fill #0

## 2018-02-02 MED FILL — OZEMPIC 0.25 OR 0.5 MG/DOSE: 2 | 28 days supply | Qty: 2 | Fill #1

## 2018-02-02 MED FILL — AMLODIPINE BESYLATE 10 MG T: 10 | 30 days supply | Qty: 30 | Fill #1

## 2018-03-08 MED FILL — LOSARTAN POTASSIUM 100 MG T: 100 | 30 days supply | Qty: 30 | Fill #2

## 2018-03-08 MED FILL — JARDIANCE 10 MG TABLET: 10 | 30 days supply | Qty: 30 | Fill #2

## 2018-03-08 MED FILL — ESCITALOPRAM 20 MG TABLET: 20 | 30 days supply | Qty: 30 | Fill #2

## 2018-04-29 MED FILL — AMLODIPINE BESYLATE 10 MG T: 10 | 30 days supply | Qty: 30 | Fill #2

## 2018-04-29 MED FILL — OZEMPIC 0.25 OR 0.5 MG/DOSE: 2 | 28 days supply | Qty: 2 | Fill #2

## 2018-04-29 MED FILL — JARDIANCE 10 MG TABLET: 10 | 30 days supply | Qty: 30 | Fill #3

## 2018-04-29 MED FILL — LOSARTAN POTASSIUM 100 MG T: 100 | 30 days supply | Qty: 30 | Fill #3

## 2018-04-29 MED FILL — ESCITALOPRAM 20 MG TABLET: 20 | 30 days supply | Qty: 30 | Fill #3

## 2018-06-21 MED FILL — JARDIANCE 10 MG TABLET: 10 | 30 days supply | Qty: 30 | Fill #4

## 2018-06-21 MED FILL — ESCITALOPRAM 20 MG TABLET: 20 | 30 days supply | Qty: 30 | Fill #4

## 2018-06-21 MED FILL — AMLODIPINE BESYLATE 10 MG T: 10 | 30 days supply | Qty: 30 | Fill #3

## 2018-06-21 MED FILL — LOSARTAN POTASSIUM 100 MG T: 100 | 30 days supply | Qty: 30 | Fill #4

## 2018-06-21 MED FILL — OZEMPIC 0.25 OR 0.5 MG/DOSE: 2 | 28 days supply | Qty: 2 | Fill #3

## 2020-03-27 ENCOUNTER — Other Ambulatory Visit (HOSPITAL_BASED_OUTPATIENT_CLINIC_OR_DEPARTMENT_OTHER): Payer: Self-pay

## 2020-03-27 DIAGNOSIS — R0683 Snoring: Secondary | ICD-10-CM

## 2020-05-06 ENCOUNTER — Ambulatory Visit (HOSPITAL_BASED_OUTPATIENT_CLINIC_OR_DEPARTMENT_OTHER): Payer: 59 | Attending: Family | Admitting: Internal Medicine

## 2020-05-06 ENCOUNTER — Other Ambulatory Visit: Payer: Self-pay

## 2020-05-06 VITALS — Ht 68.0 in | Wt 382.0 lb

## 2020-05-06 DIAGNOSIS — Z79899 Other long term (current) drug therapy: Secondary | ICD-10-CM | POA: Insufficient documentation

## 2020-05-06 DIAGNOSIS — R0683 Snoring: Secondary | ICD-10-CM | POA: Diagnosis present

## 2020-05-06 DIAGNOSIS — G4733 Obstructive sleep apnea (adult) (pediatric): Secondary | ICD-10-CM | POA: Insufficient documentation

## 2020-05-13 DIAGNOSIS — R0683 Snoring: Secondary | ICD-10-CM | POA: Diagnosis not present

## 2020-05-13 NOTE — Procedures (Signed)
Patient Name: Brittany Peterson, Brittany Peterson Date: 05/06/2020 Gender: Female D.O.B: [birth year] Age (years): 49 Referring Provider: Priscille Loveless Height (inches): 68 Interpreting Physician: Baird Lyons MD, ABSM Weight (lbs): 382 RPSGT: Gwenyth Allegra BMI: 58 MRN: [id number] Neck Size: 16.00  CLINICAL INFORMATION The patient is referred for a split night study with BPAP. MEDICATIONS Medications self-administered by patient taken the night of the study : CLONAZEPAM  SLEEP STUDY TECHNIQUE As per the AASM Manual for the Scoring of Sleep and Associated Events v2.3 (April 2016) with a hypopnea requiring 4% desaturations.  The channels recorded and monitored were frontal, central and occipital EEG, electrooculogram (EOG), submentalis EMG (chin), nasal and oral airflow, thoracic and abdominal wall motion, anterior tibialis EMG, snore microphone, electrocardiogram, and pulse oximetry. Bi-level positive airway pressure (BiPAP) was initiated when the patient met split night criteria and was titrated according to treat sleep-disordered breathing.  RESPIRATORY PARAMETERS Diagnostic  Total AHI (/hr): 153.5 RDI (/hr): 155.4 OA Index (/hr): 4.3 CA Index (/hr): 0.0 REM AHI (/hr): N/A NREM AHI (/hr): 153.5 Supine AHI (/hr): N/A Non-supine AHI (/hr): 155.38 Min O2 Sat (%): 79.0 Mean O2 (%): 89.8 Time below 88% (min): 41   Titration  Optimal IPAP Pressure (cm): 24 Optimal EPAP Pressure (cm): 20 AHI at Optimal Pressure (/hr): 0.7 Min O2 at Optimal Pressure (%): 89.0 Sleep % at Optimal (%): 75 Supine % at Optimal (%): 89     SLEEP ARCHITECTURE The study was initiated at 10:46:55 PM and terminated at 5:18:51 AM. The total recorded time was 391.9 minutes. EEG confirmed total sleep time was 312.5 minutes yielding a sleep efficiency of 79.7%%. Sleep onset after lights out was 1.3 minutes with a REM latency of 345.5 minutes. The patient spent 33.8%% of the night in stage N1 sleep, 55.5%% in stage N2 sleep,  0.0%% in stage N3 and 10.7% in REM. Wake after sleep onset (WASO) was 78.2 minutes. The Arousal Index was 23.2/hour.  LEG MOVEMENT DATA The total Periodic Limb Movements of Sleep (PLMS) were 0. The PLMS index was 0.0 .  CARDIAC DATA The 2 lead EKG demonstrated sinus rhythm. The mean heart rate was 100.0 beats per minute. Other EKG findings include: None.  IMPRESSIONS - Severe obstructive sleep apnea occurred during the diagnostic portion of the study (AHI = 153.5 /hour). - CPAP titration did not provide adequate control at tolerated pressure and was conveted to bilevel (BIPAP) titration with final presure 24/20. - No significant central sleep apnea occurred during the diagnostic portion of the study (CAI = 0.0/hour). - Moderate oxygen desaturation was noted during the diagnostic portion of the study (Min O2 = 79.0%). Mean O2 saturation on BIPAP 24/20 was 93.6%. - The patient snored with loud snoring volume during the diagnostic portion of the study. - No cardiac abnormalities were noted during this study. - Clinically significant periodic limb movements of sleep did not occur during the study.  DIAGNOSIS - Obstructive Sleep Apnea (G47.33)  RECOMMENDATIONS - Trial of BiPAP therapy on 24/20 cm H2O or auto BIPAP 12-25. - Paatient used a Medium size [employer] Full Face Mask Simplus mask and heated humidification. - Be careful with alcohol, sedatives and other CNS depressants that may worsen sleep apnea and disrupt normal sleep architecture. - Sleep hygiene should be reviewed to assess factors that may improve sleep quality. - Weight management and regular exercise should be initiated or continued.  [Electronically signed] 05/13/2020 12:27 PM  Baird Lyons MD, ABSM Diplomate, American Board of Sleep Medicine   NPI:  [id number]                        [place], American Board of Sleep Medicine  ELECTRONICALLY SIGNED ON:  05/13/2020, 12:17 PM [place] PH: [phone]   FX: [phone] [place]

## 2022-11-20 NOTE — Progress Notes (Signed)
 Subjective Patient ID: Brittany Peterson is a 52 y.o. female.  Patient is a 52 year old female who comes to clinic with c/o abscess under left breast x 3 days.  She states it has been draining intermittently for the past 3 days.  She has been using dry heat for her symptoms.  She has a PMH of T2DM   History provided by:  Patient Language interpreter used: No     Review of Systems  Constitutional: Negative.  Negative for fever.  Respiratory: Negative.    Cardiovascular: Negative.   Skin:  Positive for color change.       Abscess left breast  All other systems reviewed and are negative.   Patient History  Allergies: Allergies  Allergen Reactions  . Cephalexin Other, Diarrhea and Itching     Past Medical History:  Diagnosis Date  . Diabetes mellitus   . Hypertension    History reviewed. No pertinent surgical history. Social History   Socioeconomic History  . Marital status: Single    Spouse name: Not on file  . Number of children: Not on file  . Years of education: Not on file  . Highest education level: Not on file  Occupational History  . Not on file  Tobacco Use  . Smoking status: Never  . Smokeless tobacco: Never  Substance and Sexual Activity  . Alcohol use: Yes    Comment: Occasionally  . Drug use: Never  . Sexual activity: Not on file  Other Topics Concern  . Not on file  Social History Narrative  . Not on file   History reviewed. No pertinent family history. Current Outpatient Medications on File Prior to Visit  Medication Sig Dispense Refill  . amLODIPine (Norvasc) 10 MG tablet Take 1 tablet by mouth 1 (one) time each day.    . clonazePAM (KlonoPIN) 0.5 MG tablet TAKE 1 TABLET BY MOUTH TWICE DAILY AS NEEDED FOR SEVERE PANIC    . doxepin (SINEquan) 10 MG capsule doxepin 10 mg capsule  TAKE 1 TO 2 CAPSULES BY MOUTH 30 MINUTES BEFORE BEDTIME FOR SLEEP    . escitalopram (Lexapro) 20 MG tablet Take 1-2 tablets by mouth 1 (one) time each day.    .  levocetirizine (Xyzal) 5 MG tablet Take 1 tablet by mouth every night.    . losartan (Cozaar) 100 MG tablet Take 1 tablet by mouth 1 (one) time each day.    . Semaglutide, 1 MG/DOSE, (Ozempic, 1 MG/DOSE,) 4 MG/3ML solution pen-injector INJECT 1 MG UNDER THE SKIN WEEKLY     No current facility-administered medications on file prior to visit.     Objective  Vitals:   11/20/22 1242  BP: 119/75  BP Location: Right arm  Patient Position: Sitting  Pulse: 89  Resp: 16  Temp: 37.1 C (98.7 F)  TempSrc: Oral  SpO2: 99%  Weight: (!) 177 kg  Height: 5' 7  PainSc:   5                No results found.  Physical Exam Vitals and nursing note reviewed.  Constitutional:      Appearance: Normal appearance. She is not ill-appearing.  Cardiovascular:     Rate and Rhythm: Normal rate and regular rhythm.     Pulses: Normal pulses.     Heart sounds: Normal heart sounds.  Pulmonary:     Effort: Pulmonary effort is normal.     Breath sounds: Normal breath sounds.  Skin:    General: Skin is  warm and dry.     Findings: Lesion present. No erythema.  Neurological:     Mental Status: She is alert.      No results found for this visit on 11/20/22.   Procedures MDM:     1 Acute illness with systemic symptoms     Assessment requiring historian other than patient: No     Independent visualization of image, tracing, or test: No     Discussion of management with another provider: No     Risk:: Moderate          Assessment/Plan Diagnoses and all orders for this visit:  Abscess of left breast -     doxycycline (Vibramycin) 100 MG capsule; Take 1 capsule (100 mg total) by mouth in the morning and 1 capsule (100 mg total) in the evening. Do all this for 10 days. Take with at least 8 ounces (large glass) of water, do not lie down for 30 minutes after.         Progress note signed by Montie Rao, NP on 11/20/22 at 12:56 PM

## 2022-11-20 NOTE — Progress Notes (Signed)
 Brittany Peterson is a 52 y.o. female states she has an abscess on left breast area. Symptoms a month ago worsen in last 3 days.

## 2024-01-26 ENCOUNTER — Emergency Department (HOSPITAL_COMMUNITY)
Admission: EM | Admit: 2024-01-26 | Discharge: 2024-01-26 | Disposition: A | Discharge status: Home / Self Care | Attending: Emergency Medicine | Admitting: Emergency Medicine

## 2024-01-26 ENCOUNTER — Other Ambulatory Visit: Payer: Self-pay

## 2024-01-26 ENCOUNTER — Encounter (HOSPITAL_COMMUNITY): Payer: Self-pay | Admitting: Emergency Medicine

## 2024-01-26 DIAGNOSIS — R899 Unspecified abnormal finding in specimens from other organs, systems and tissues: Secondary | ICD-10-CM | POA: Insufficient documentation

## 2024-01-26 LAB — CBC WITH DIFFERENTIAL/PLATELET
Abs Immature Granulocytes: 0.03 K/uL (ref 0.00–0.07)
Basophils Absolute: 0.1 K/uL (ref 0.0–0.1)
Basophils Relative: 1 %
Eosinophils Absolute: 0.2 K/uL (ref 0.0–0.5)
Eosinophils Relative: 2 %
HCT: 46.7 % — ABNORMAL HIGH (ref 36.0–46.0)
Hemoglobin: 15.7 g/dL — ABNORMAL HIGH (ref 12.0–15.0)
Immature Granulocytes: 0 %
Lymphocytes Relative: 37 %
Lymphs Abs: 3.8 K/uL (ref 0.7–4.0)
MCH: 28.2 pg (ref 26.0–34.0)
MCHC: 33.6 g/dL (ref 30.0–36.0)
MCV: 83.8 fL (ref 80.0–100.0)
Monocytes Absolute: 0.6 K/uL (ref 0.1–1.0)
Monocytes Relative: 6 %
Neutro Abs: 5.7 K/uL (ref 1.7–7.7)
Neutrophils Relative %: 54 %
Platelets: 333 K/uL (ref 150–400)
RBC: 5.57 MIL/uL — ABNORMAL HIGH (ref 3.87–5.11)
RDW: 14.6 % (ref 11.5–15.5)
WBC: 10.5 K/uL (ref 4.0–10.5)
nRBC: 0 % (ref 0.0–0.2)

## 2024-01-26 LAB — BASIC METABOLIC PANEL WITH GFR
Anion gap: 10 (ref 5–15)
BUN: 14 mg/dL (ref 6–20)
CO2: 24 mmol/L (ref 22–32)
Calcium: 8.8 mg/dL — ABNORMAL LOW (ref 8.9–10.3)
Chloride: 99 mmol/L (ref 98–111)
Creatinine, Ser: 0.74 mg/dL (ref 0.44–1.00)
GFR, Estimated: >60 mL/min (ref >60)
Glucose, Bld: 270 mg/dL — ABNORMAL HIGH (ref 70–99)
Potassium: 4.4 mmol/L (ref 3.5–5.1)
Sodium: 133 mmol/L — ABNORMAL LOW (ref 135–145)

## 2024-01-26 NOTE — ED Provider Notes (Signed)
 [place] EMERGENCY DEPARTMENT AT [facility] Provider Note   CSN: [id number] Arrival date & time: 01/26/24  0410     Patient presents with: Abnormal Labs   Brittany Peterson is a 53 y.o. female.  Patient presents to the emergency department over concerns of abnormal labs.  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  She is unsure of the disorder.  She states that for the past year she has frequently taken ibuprofen.  She had labs drawn at her primary care provider's office earlier this week and was called today by her PCP and told that her creatinine was 4 and her BUN was 25.  The patient was concerned about kidney failure and was sent to the emergency department for evaluation.  She is not here for other active complaints.  She does note chronic pain and has stopped taking ibuprofen as of this time.  Her primary care is working on a plan to help with her autoimmune disorder.   HPI     Prior to Admission medications   Medication Sig Start Date End Date Taking? Authorizing Provider  albuterol  (PROVENTIL  HFA;VENTOLIN  HFA) 108 (90 BASE) MCG/ACT inhaler Inhale 1-2 puffs into the lungs every 6 (six) hours as needed for wheezing. 11/29/11 11/28/12  Sonda Alm BROCKS, MD  chlorpheniramine-HYDROcodone (TUSSIONEX) 10-8 MG/5ML [facility] Take 5 mLs by mouth every 12 (twelve) hours as needed. 11/29/11   Sonda Alm BROCKS, MD  DULoxetine HCl (CYMBALTA PO) Take by mouth.    [provider]  fluticasone  (FLONASE ) 50 MCG/ACT nasal spray Place 2 sprays into the nose daily. 11/29/11 11/28/12  Sonda Alm BROCKS, MD  ipratropium (ATROVENT ) 0.06 % nasal spray Place 2 sprays into both nostrils 4 (four) times daily. 10/18/13   Kindl, James D, MD  Liraglutide (VICTOZA [place]) Inject into the skin.    [provider]  LOSARTAN POTASSIUM PO Take by mouth.    [provider]  meloxicam  (MOBIC ) 7.5 MG tablet Take 1 tablet (7.5 mg total) by mouth daily. 03/08/12   Chatten,  Dedra CROME, NP  METFORMIN HCL PO Take by mouth.    [provider]  minocycline  (MINOCIN ,DYNACIN ) 100 MG capsule Take 1 capsule (100 mg total) by mouth 2 (two) times daily. 10/18/13   Vincente Lynwood BIRCH, MD  OVER THE COUNTER MEDICATION Birth counter pill. Patient reports taking multiple cold medicines    [provider]  traMADol  (ULTRAM ) 50 MG tablet Take 1 tablet (50 mg total) by mouth every 6 (six) hours as needed for pain. 03/08/12   Chatten, Dedra CROME, NP    Allergies: Keflex [cephalexin]    Review of Systems  Updated Vital Signs BP (!) 160/84 (BP Location: Right Wrist)   Pulse 95   Temp 98.9 F (37.2 C) (Oral)   Resp 18   Ht 5' 8 (1.727 m)   Wt (!) 173 kg   SpO2 100%   BMI 57.99 kg/m   Physical Exam Vitals and nursing note reviewed.  HENT:     Head: Normocephalic and atraumatic.  Eyes:     Conjunctiva/sclera: Conjunctivae normal.  Pulmonary:     Effort: Pulmonary effort is normal. No respiratory distress.  Musculoskeletal:        General: No signs of injury.     Cervical back: Normal range of motion.  Skin:    General: Skin is dry.  Neurological:     Mental Status: She is alert.  Psychiatric:  Speech: Speech normal.        Behavior: Behavior normal.     (all labs ordered are listed, but only abnormal results are displayed) Labs Reviewed  BASIC METABOLIC PANEL WITH GFR - Abnormal; Notable for the following components:      Result Value   Sodium 133 (*)    Glucose, Bld 270 (*)    Calcium 8.8 (*)    All other components within normal limits  CBC WITH DIFFERENTIAL/PLATELET - Abnormal; Notable for the following components:   RBC 5.57 (*)    Hemoglobin 15.7 (*)    HCT 46.7 (*)    All other components within normal limits    EKG: None  Radiology: No results found.   Procedures   Medications Ordered in the ED - No data to display                                  Medical Decision Making Amount and/or Complexity of Data  Reviewed Labs: ordered.   Patient presents to the emergency room and with concerns over abnormal labs.  Lab values would suggest potential kidney injury.  I ordered and interpreted labs including CBC and BMP.  Creatinine was less than 1 with a BUN of 14.  No sign of kidney injury at this time.  GFR is greater then 60.  I reassured the patient about her lab values.  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  At this time with reassuring lab values showing no sort of kidney injury, the patient appears stable for discharge home with continued outpatient follow-up to manage her chronic pain.  The patient feels greatly reassured and comfortable with the plan.     Final diagnoses:  Abnormal laboratory test result    ED Discharge Orders     None          Logan Ubaldo KATHEE DEVONNA 01/26/24 [id number]    Jerral Meth, MD 01/26/24 [phone]

## 2024-01-26 NOTE — Discharge Instructions (Signed)
 You were seen this morning for evaluation after abnormal labs were reported by your primary care provider.  Labs were drawn here in the emergency department which were reassuring showing grossly normal kidney function.  Please continue to follow with your primary care team for further management of your autoimmune disorder and other chronic issues.  Return to the emergency department if you develop any life-threatening symptoms.

## 2024-01-26 NOTE — ED Triage Notes (Signed)
 Pt states that her pcp called her yesterday with abnormal labs of BUN 25, Creatinine 4.

## 2024-02-03 NOTE — Progress Notes (Signed)
 Diabetes Self-Management Education  Visit Type: First/Initial  Appt. Start Time: 1024 Appt. End Time: 1128  02/12/2024  [name], identified by name and date of birth, is a 53 y.o. female with a diagnosis of Diabetes: Type 2.   ASSESSMENT  Patient is here today alone with assistance device. Pt would like to focus on anti inflammation foods in her diet with managing diabetes. Pt reports her mobility limited d/t auto immune disease diagnosis. Patient lives alone and does her shopping using insta cart and cooking Pt reports she is working from home full time most sitting from 8p-8 am. Pt reports she wakes up around 7pm for work.   Pt reports most recent A1C of 8.2% obtained last month. Pt reports she plans to start PT and interested in chair exercises. Pt reports suing a watch to test her blood sugars and states she does not have access to a glucometer at this time. Pt denies Jardiance administration r/t cost. Pt reports her appetite is well controlled with weekly GLP administration.  Pt reports eating out twice weekly. Pt reports she has sleep apnea and denies CPAP. Pt reprtos she avoid dark colored sodas. Pt reports selecting unsweet almond milk and can tolerate cheese and greek yogurt. Pt reports typical intake of two meals skipping lunch most days. All Pt's questions were answered during this encounter.   History includes:   Past Medical History:  Diagnosis Date   Depression    Diabetes mellitus    Hypertension     Medications include:  gabapentin 600 mg TID, losartan 100 mg, mounjaro 7.5 mg weekly, escitalopram 20 mg, levocetirizine 5mg  PRN Labs noted: No results found for: HGBA1C No results found for: CHOL, HDL, LDLCALC, LDLDIRECT, TRIG, CHOLHDL  Wt Readings from Last 3 Encounters:  01/26/24 (!) 381 lb 6.3 oz (173 kg)  05/06/20 (!) 382 lb (173.3 kg)   There were no vitals taken for this visit. There is no height or weight on file to calculate BMI.    Diabetes Self-Management Education - 02/12/24 1031       Visit Information   Visit Type First/Initial      Initial Visit   Diabetes Type Type 2    Date Diagnosed 15 years ago    Are you currently following a meal plan? No          Individualized Plan for Diabetes Self-Management Training:   Learning Objective:  Patient will have a greater understanding of diabetes self-management. Patient education plan is to attend individual and/or group sessions per assessed needs and concerns.   Plan:   Patient Instructions  Aim for balanced meals on a schedule; avoid skipping meals    Expected Outcomes:  Demonstrated interest in learning but significant barriers to change  Education material provided: ADA - How to Thrive: A Guide for Your Journey with Diabetes, My Plate, Snack sheet, Support group flyer, and Diabetes Resources  If problems or questions, patient to contact team via:  Phone  Future DSME appointment: 3-4 months

## 2024-02-12 ENCOUNTER — Encounter: Payer: Self-pay | Attending: Nurse Practitioner | Admitting: Dietician

## 2024-02-12 DIAGNOSIS — E1140 Type 2 diabetes mellitus with diabetic neuropathy, unspecified: Secondary | ICD-10-CM | POA: Diagnosis present

## 2024-02-12 NOTE — Patient Instructions (Signed)
 Aim for balanced meals on a schedule; avoid skipping meals

## 2024-03-10 NOTE — Progress Notes (Deleted)
 11/7:   Aim for balanced meals on a schedule; avoid skipping meals    Patient is here today alone with assistance device. Pt would like to focus on anti inflammation foods in her diet with managing diabetes. Pt reports her mobility limited d/t auto immune disease diagnosis. Patient lives alone and does her shopping using insta cart and cooking Pt reports she is working from home full time most sitting from 8p-8 am. Pt reports she wakes up around 7pm for work.   Pt reports most recent A1C of 8.2% obtained last month. Pt reports she plans to start PT and interested in chair exercises. Pt reports using a watch to test her blood sugars and states she does not have access to a glucometer at this time. Pt denies Jardiance administration r/t cost. Pt reports her appetite is well controlled with weekly GLP administration.  Pt reports eating out twice weekly. Pt reports she has sleep apnea and denies CPAP. Pt reprtos she avoids dark colored sodas. Pt reports selecting unsweet almond milk and can tolerate cheese and greek yogurt. Pt reports typical intake of two meals skipping lunch most days. All Pt's questions were answered during this encounter.

## 2024-03-18 ENCOUNTER — Encounter: Admitting: Dietician

## 2024-03-18 DIAGNOSIS — E1140 Type 2 diabetes mellitus with diabetic neuropathy, unspecified: Secondary | ICD-10-CM
